# Patient Record
Sex: Male | Born: 1955 | Race: Black or African American | Hispanic: No | Marital: Married | Smoking: Former smoker
Health system: Southern US, Community
[De-identification: ages and names within clinical notes are randomized; demographics above are authoritative.]

## PROBLEM LIST (undated history)

## (undated) DIAGNOSIS — I340 Nonrheumatic mitral (valve) insufficiency: Secondary | ICD-10-CM

## (undated) DIAGNOSIS — R748 Abnormal levels of other serum enzymes: Secondary | ICD-10-CM

## (undated) DIAGNOSIS — M5090 Cervical disc disorder, unspecified, unspecified cervical region: Secondary | ICD-10-CM

## (undated) DIAGNOSIS — I10 Essential (primary) hypertension: Secondary | ICD-10-CM

## (undated) HISTORY — PX: OTHER SURGICAL HISTORY: SHX169

## (undated) HISTORY — DX: Nonrheumatic mitral (valve) insufficiency: I34.0

## (undated) HISTORY — DX: Essential (primary) hypertension: I10

## (undated) HISTORY — DX: Abnormal levels of other serum enzymes: R74.8

## (undated) HISTORY — DX: Cervical disc disorder, unspecified, unspecified cervical region: M50.90

---

## 1999-05-05 ENCOUNTER — Encounter: Admission: RE | Admit: 1999-05-05 | Discharge: 1999-05-05 | Payer: Self-pay | Admitting: Otolaryngology

## 1999-05-05 ENCOUNTER — Encounter: Payer: Self-pay | Admitting: Otolaryngology

## 2001-07-31 ENCOUNTER — Encounter: Admission: RE | Admit: 2001-07-31 | Discharge: 2001-07-31 | Payer: Self-pay | Admitting: Geriatric Medicine

## 2001-07-31 ENCOUNTER — Encounter: Payer: Self-pay | Admitting: Geriatric Medicine

## 2009-12-16 ENCOUNTER — Ambulatory Visit (HOSPITAL_COMMUNITY): Admission: RE | Admit: 2009-12-16 | Discharge: 2009-12-16 | Payer: Self-pay | Admitting: Gastroenterology

## 2014-11-06 ENCOUNTER — Ambulatory Visit
Admission: RE | Admit: 2014-11-06 | Discharge: 2014-11-06 | Disposition: A | Discharge status: Home / Self Care | Payer: BLUE CROSS/BLUE SHIELD | Source: Ambulatory Visit | Attending: Geriatric Medicine | Admitting: Geriatric Medicine

## 2014-11-06 ENCOUNTER — Other Ambulatory Visit: Payer: Self-pay | Admitting: Geriatric Medicine

## 2014-11-06 DIAGNOSIS — R0989 Other specified symptoms and signs involving the circulatory and respiratory systems: Secondary | ICD-10-CM

## 2015-04-28 ENCOUNTER — Other Ambulatory Visit: Payer: Self-pay | Admitting: Internal Medicine

## 2015-04-28 ENCOUNTER — Ambulatory Visit
Admission: RE | Admit: 2015-04-28 | Discharge: 2015-04-28 | Disposition: A | Discharge status: Home / Self Care | Payer: BLUE CROSS/BLUE SHIELD | Source: Ambulatory Visit | Attending: Internal Medicine | Admitting: Internal Medicine

## 2015-04-28 DIAGNOSIS — M25562 Pain in left knee: Secondary | ICD-10-CM

## 2015-11-10 ENCOUNTER — Other Ambulatory Visit (HOSPITAL_COMMUNITY): Payer: Self-pay | Admitting: Geriatric Medicine

## 2015-11-10 DIAGNOSIS — I340 Nonrheumatic mitral (valve) insufficiency: Secondary | ICD-10-CM

## 2015-11-10 DIAGNOSIS — Z79899 Other long term (current) drug therapy: Secondary | ICD-10-CM | POA: Diagnosis not present

## 2015-11-10 DIAGNOSIS — I10 Essential (primary) hypertension: Secondary | ICD-10-CM | POA: Diagnosis not present

## 2015-11-10 DIAGNOSIS — Z0000 Encounter for general adult medical examination without abnormal findings: Secondary | ICD-10-CM | POA: Diagnosis not present

## 2015-11-24 ENCOUNTER — Ambulatory Visit (HOSPITAL_COMMUNITY): Payer: BLUE CROSS/BLUE SHIELD | Attending: Cardiology

## 2015-11-24 ENCOUNTER — Other Ambulatory Visit: Payer: Self-pay

## 2015-11-24 DIAGNOSIS — I340 Nonrheumatic mitral (valve) insufficiency: Secondary | ICD-10-CM | POA: Insufficient documentation

## 2015-12-03 ENCOUNTER — Ambulatory Visit: Payer: BLUE CROSS/BLUE SHIELD

## 2015-12-03 ENCOUNTER — Ambulatory Visit (INDEPENDENT_AMBULATORY_CARE_PROVIDER_SITE_OTHER): Payer: BLUE CROSS/BLUE SHIELD

## 2015-12-03 ENCOUNTER — Ambulatory Visit (INDEPENDENT_AMBULATORY_CARE_PROVIDER_SITE_OTHER): Payer: BLUE CROSS/BLUE SHIELD | Admitting: Podiatry

## 2015-12-03 ENCOUNTER — Encounter: Payer: Self-pay | Admitting: Podiatry

## 2015-12-03 VITALS — BP 124/78 | HR 73 | Resp 16

## 2015-12-03 DIAGNOSIS — B372 Candidiasis of skin and nail: Secondary | ICD-10-CM | POA: Diagnosis not present

## 2015-12-03 DIAGNOSIS — M79675 Pain in left toe(s): Secondary | ICD-10-CM

## 2015-12-03 DIAGNOSIS — M79671 Pain in right foot: Secondary | ICD-10-CM | POA: Diagnosis not present

## 2015-12-03 MED ORDER — TERBINAFINE HCL 250 MG PO TABS: 250.0000 mg | ORAL_TABLET | Freq: Every day | ORAL | Status: AC

## 2015-12-03 NOTE — Progress Notes (Signed)
   Subjective:    Patient ID: Carl Nash, male    DOB: 1955-11-29, 60 y.o.   MRN: [id number]  HPI Chief Complaint  Patient presents with  . Toe Pain    Left foot; 2nd toe; discoloration of toe; pt stated, "got bitten an insect a year ago and has had antibiotics; after this toe discolored"  . Callouses    Right foot; 5th toe-top & below of toe; x6 months      Review of Systems  Musculoskeletal: Positive for arthralgias.  Allergic/Immunologic: Positive for environmental allergies.  All other systems reviewed and are negative.      Objective:   Physical Exam        Assessment & Plan:

## 2015-12-05 NOTE — Progress Notes (Signed)
Subjective:     Patient ID: Carl RakeVan J Nash, male   DOB: Dec 28, 1955, 60 y.o.   MRN: [id number]  HPI patient states I have had problems with my left foot around the second digit with discoloration and irritation and this is been going on now for around a year after I was bit by an insect. It itches and at times blisters and I was on an antibiotic early   Review of Systems  All other systems reviewed and are negative.      Objective:   Physical Exam  Constitutional: He is oriented to person, place, and time.  Cardiovascular: Intact distal pulses.   Musculoskeletal: Normal range of motion.  Neurological: He is oriented to person, place, and time.  Skin: Skin is warm.  Nursing note and vitals reviewed.  neurovascular status intact muscle strength adequate range of motion within normal limits with patient found to have discoloration of the second digit left foot with blistering around the distal lateral tip and no proximal edema erythema drainage noted. Also slight discoloration of the right fifth digit     Assessment:     May be a fungal infection versus other pathology at this time    Plan:     H&P and x-ray reviewed. At this time I placed him on 45 days of oral antifungal therapy along with over-the-counter Lamisil topical and we will reevaluate him again in 2 or 3 months or earlier if any issues should occur  Report left negative for signs of bony changes or other pathology

## 2016-04-11 DIAGNOSIS — Z23 Encounter for immunization: Secondary | ICD-10-CM | POA: Diagnosis not present

## 2016-09-13 DIAGNOSIS — H2513 Age-related nuclear cataract, bilateral: Secondary | ICD-10-CM | POA: Diagnosis not present

## 2016-09-13 DIAGNOSIS — H40003 Preglaucoma, unspecified, bilateral: Secondary | ICD-10-CM | POA: Diagnosis not present

## 2016-09-13 DIAGNOSIS — H40023 Open angle with borderline findings, high risk, bilateral: Secondary | ICD-10-CM | POA: Diagnosis not present

## 2016-09-13 DIAGNOSIS — H40033 Anatomical narrow angle, bilateral: Secondary | ICD-10-CM | POA: Diagnosis not present

## 2016-11-23 DIAGNOSIS — H40033 Anatomical narrow angle, bilateral: Secondary | ICD-10-CM | POA: Diagnosis not present

## 2016-11-23 DIAGNOSIS — H25013 Cortical age-related cataract, bilateral: Secondary | ICD-10-CM | POA: Diagnosis not present

## 2016-11-23 DIAGNOSIS — H40011 Open angle with borderline findings, low risk, right eye: Secondary | ICD-10-CM | POA: Diagnosis not present

## 2016-11-23 DIAGNOSIS — H40012 Open angle with borderline findings, low risk, left eye: Secondary | ICD-10-CM | POA: Diagnosis not present

## 2017-01-22 DIAGNOSIS — Z125 Encounter for screening for malignant neoplasm of prostate: Secondary | ICD-10-CM | POA: Diagnosis not present

## 2017-01-22 DIAGNOSIS — Z0000 Encounter for general adult medical examination without abnormal findings: Secondary | ICD-10-CM | POA: Diagnosis not present

## 2017-01-22 DIAGNOSIS — I10 Essential (primary) hypertension: Secondary | ICD-10-CM | POA: Diagnosis not present

## 2017-01-22 DIAGNOSIS — Z79899 Other long term (current) drug therapy: Secondary | ICD-10-CM | POA: Diagnosis not present

## 2017-04-17 DIAGNOSIS — Z23 Encounter for immunization: Secondary | ICD-10-CM | POA: Diagnosis not present

## 2017-04-18 ENCOUNTER — Other Ambulatory Visit: Payer: Self-pay | Admitting: Geriatric Medicine

## 2017-04-18 ENCOUNTER — Ambulatory Visit
Admission: RE | Admit: 2017-04-18 | Discharge: 2017-04-18 | Disposition: A | Discharge status: Home / Self Care | Payer: BLUE CROSS/BLUE SHIELD | Source: Ambulatory Visit | Attending: Geriatric Medicine | Admitting: Geriatric Medicine

## 2017-04-18 DIAGNOSIS — M542 Cervicalgia: Secondary | ICD-10-CM

## 2017-07-25 DIAGNOSIS — Z79899 Other long term (current) drug therapy: Secondary | ICD-10-CM | POA: Diagnosis not present

## 2017-07-25 DIAGNOSIS — M5410 Radiculopathy, site unspecified: Secondary | ICD-10-CM | POA: Diagnosis not present

## 2017-07-25 DIAGNOSIS — I10 Essential (primary) hypertension: Secondary | ICD-10-CM | POA: Diagnosis not present

## 2018-02-15 DIAGNOSIS — Z136 Encounter for screening for cardiovascular disorders: Secondary | ICD-10-CM | POA: Diagnosis not present

## 2018-02-15 DIAGNOSIS — Z79899 Other long term (current) drug therapy: Secondary | ICD-10-CM | POA: Diagnosis not present

## 2018-02-15 DIAGNOSIS — I10 Essential (primary) hypertension: Secondary | ICD-10-CM | POA: Diagnosis not present

## 2018-02-15 DIAGNOSIS — Z0000 Encounter for general adult medical examination without abnormal findings: Secondary | ICD-10-CM | POA: Diagnosis not present

## 2018-02-20 ENCOUNTER — Ambulatory Visit
Admission: RE | Admit: 2018-02-20 | Discharge: 2018-02-20 | Disposition: A | Discharge status: Home / Self Care | Payer: BLUE CROSS/BLUE SHIELD | Source: Ambulatory Visit | Attending: Geriatric Medicine | Admitting: Geriatric Medicine

## 2018-02-20 ENCOUNTER — Other Ambulatory Visit: Payer: Self-pay | Admitting: Geriatric Medicine

## 2018-02-20 DIAGNOSIS — J3489 Other specified disorders of nose and nasal sinuses: Secondary | ICD-10-CM

## 2018-02-20 DIAGNOSIS — J320 Chronic maxillary sinusitis: Secondary | ICD-10-CM | POA: Diagnosis not present

## 2018-04-16 DIAGNOSIS — Z23 Encounter for immunization: Secondary | ICD-10-CM | POA: Diagnosis not present

## 2018-08-21 DIAGNOSIS — Z79899 Other long term (current) drug therapy: Secondary | ICD-10-CM | POA: Diagnosis not present

## 2018-08-21 DIAGNOSIS — I10 Essential (primary) hypertension: Secondary | ICD-10-CM | POA: Diagnosis not present

## 2018-11-11 DIAGNOSIS — I10 Essential (primary) hypertension: Secondary | ICD-10-CM | POA: Diagnosis not present

## 2019-01-23 DIAGNOSIS — H353121 Nonexudative age-related macular degeneration, left eye, early dry stage: Secondary | ICD-10-CM | POA: Diagnosis not present

## 2019-01-23 DIAGNOSIS — H04123 Dry eye syndrome of bilateral lacrimal glands: Secondary | ICD-10-CM | POA: Diagnosis not present

## 2019-01-23 DIAGNOSIS — H353110 Nonexudative age-related macular degeneration, right eye, stage unspecified: Secondary | ICD-10-CM | POA: Diagnosis not present

## 2019-01-23 DIAGNOSIS — H40023 Open angle with borderline findings, high risk, bilateral: Secondary | ICD-10-CM | POA: Diagnosis not present

## 2019-04-21 DIAGNOSIS — Z23 Encounter for immunization: Secondary | ICD-10-CM | POA: Diagnosis not present

## 2019-04-21 DIAGNOSIS — Z0000 Encounter for general adult medical examination without abnormal findings: Secondary | ICD-10-CM | POA: Diagnosis not present

## 2019-04-21 DIAGNOSIS — Z79899 Other long term (current) drug therapy: Secondary | ICD-10-CM | POA: Diagnosis not present

## 2019-04-21 DIAGNOSIS — I10 Essential (primary) hypertension: Secondary | ICD-10-CM | POA: Diagnosis not present

## 2019-05-09 DIAGNOSIS — L509 Urticaria, unspecified: Secondary | ICD-10-CM | POA: Diagnosis not present

## 2019-06-23 DIAGNOSIS — Z03818 Encounter for observation for suspected exposure to other biological agents ruled out: Secondary | ICD-10-CM | POA: Diagnosis not present

## 2019-07-24 DIAGNOSIS — H353110 Nonexudative age-related macular degeneration, right eye, stage unspecified: Secondary | ICD-10-CM | POA: Diagnosis not present

## 2019-07-24 DIAGNOSIS — H0015 Chalazion left lower eyelid: Secondary | ICD-10-CM | POA: Diagnosis not present

## 2019-07-24 DIAGNOSIS — H40033 Anatomical narrow angle, bilateral: Secondary | ICD-10-CM | POA: Diagnosis not present

## 2019-07-24 DIAGNOSIS — H353121 Nonexudative age-related macular degeneration, left eye, early dry stage: Secondary | ICD-10-CM | POA: Diagnosis not present

## 2019-08-11 DIAGNOSIS — H0015 Chalazion left lower eyelid: Secondary | ICD-10-CM | POA: Diagnosis not present

## 2019-08-11 DIAGNOSIS — H0102A Squamous blepharitis right eye, upper and lower eyelids: Secondary | ICD-10-CM | POA: Diagnosis not present

## 2019-08-11 DIAGNOSIS — H0102B Squamous blepharitis left eye, upper and lower eyelids: Secondary | ICD-10-CM | POA: Diagnosis not present

## 2019-08-13 IMAGING — DX DG SINUSES 1-2V
2 series · 2 of 2 positions shown · non-contrast
Comparison: Diffuse mucosal edema throughout the right maxillary
sinus.

CLINICAL DATA: Maxillary and frontal sinus pressure

EXAM:
PARANASAL SINUSES - 1-2 VIEW

[dg sinus 1-2 views (1 of 2)]
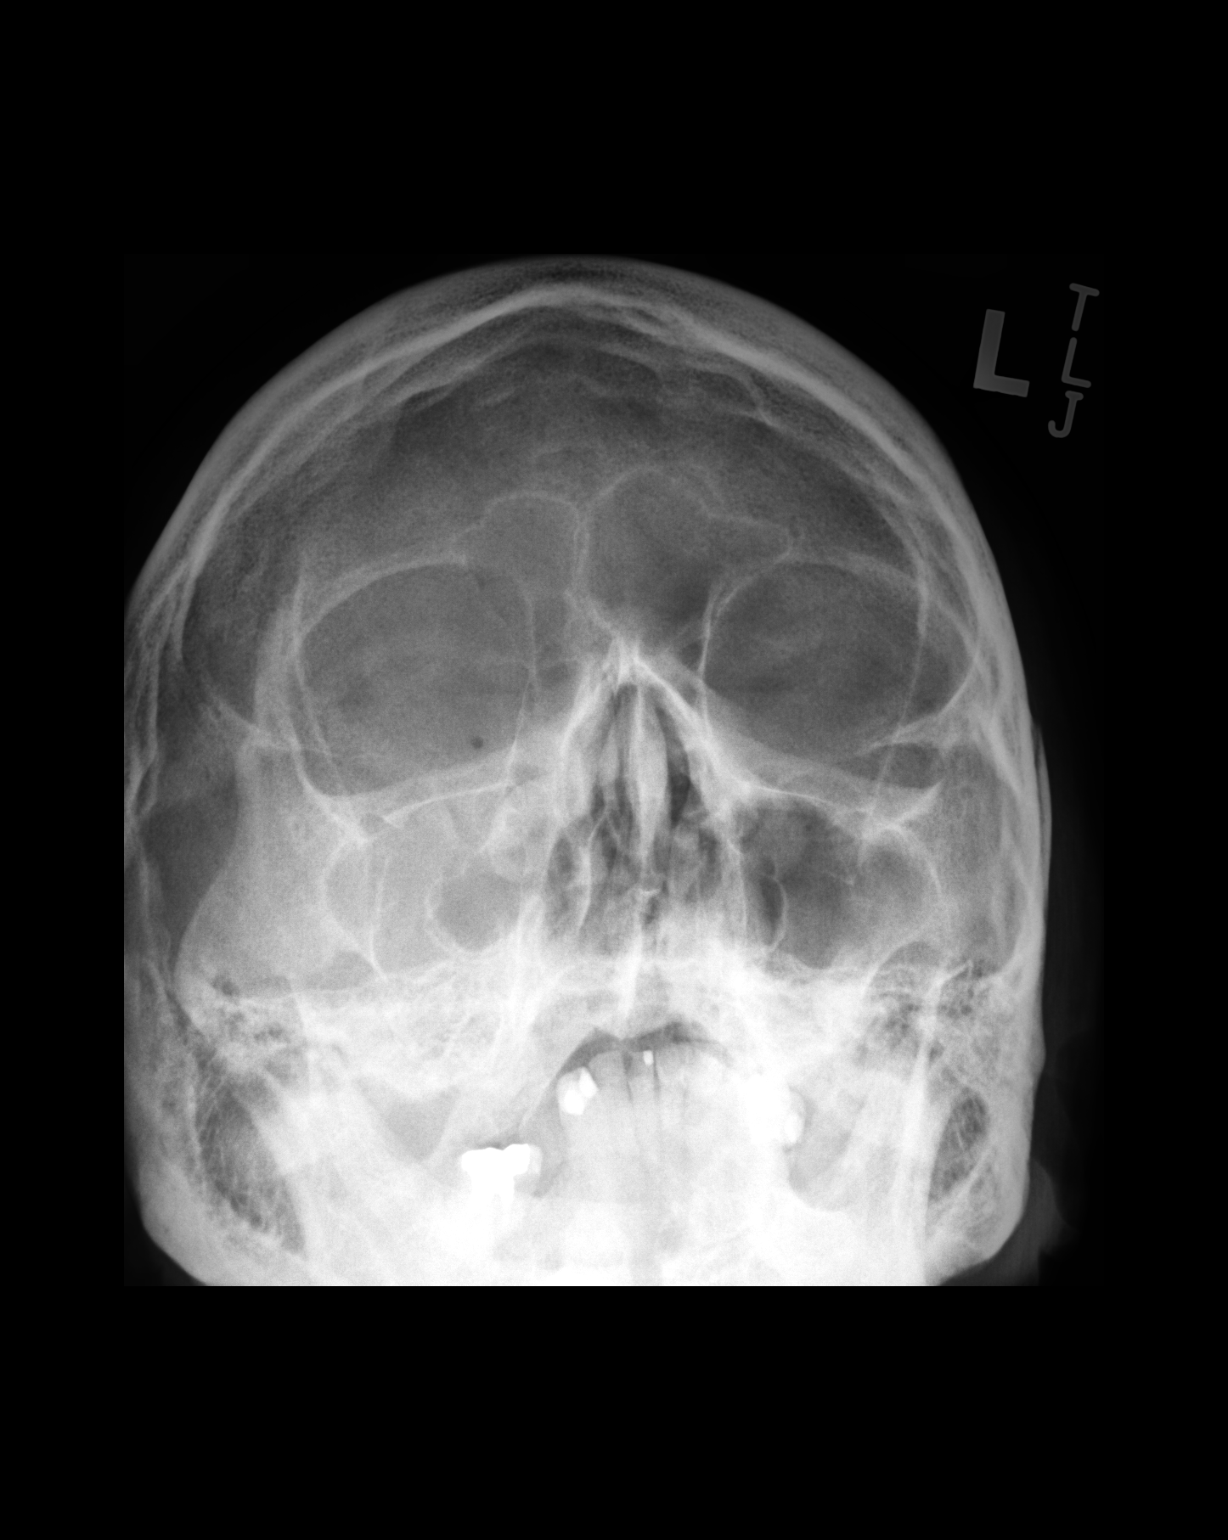

[dg sinus 1-2 views (2 of 2)]
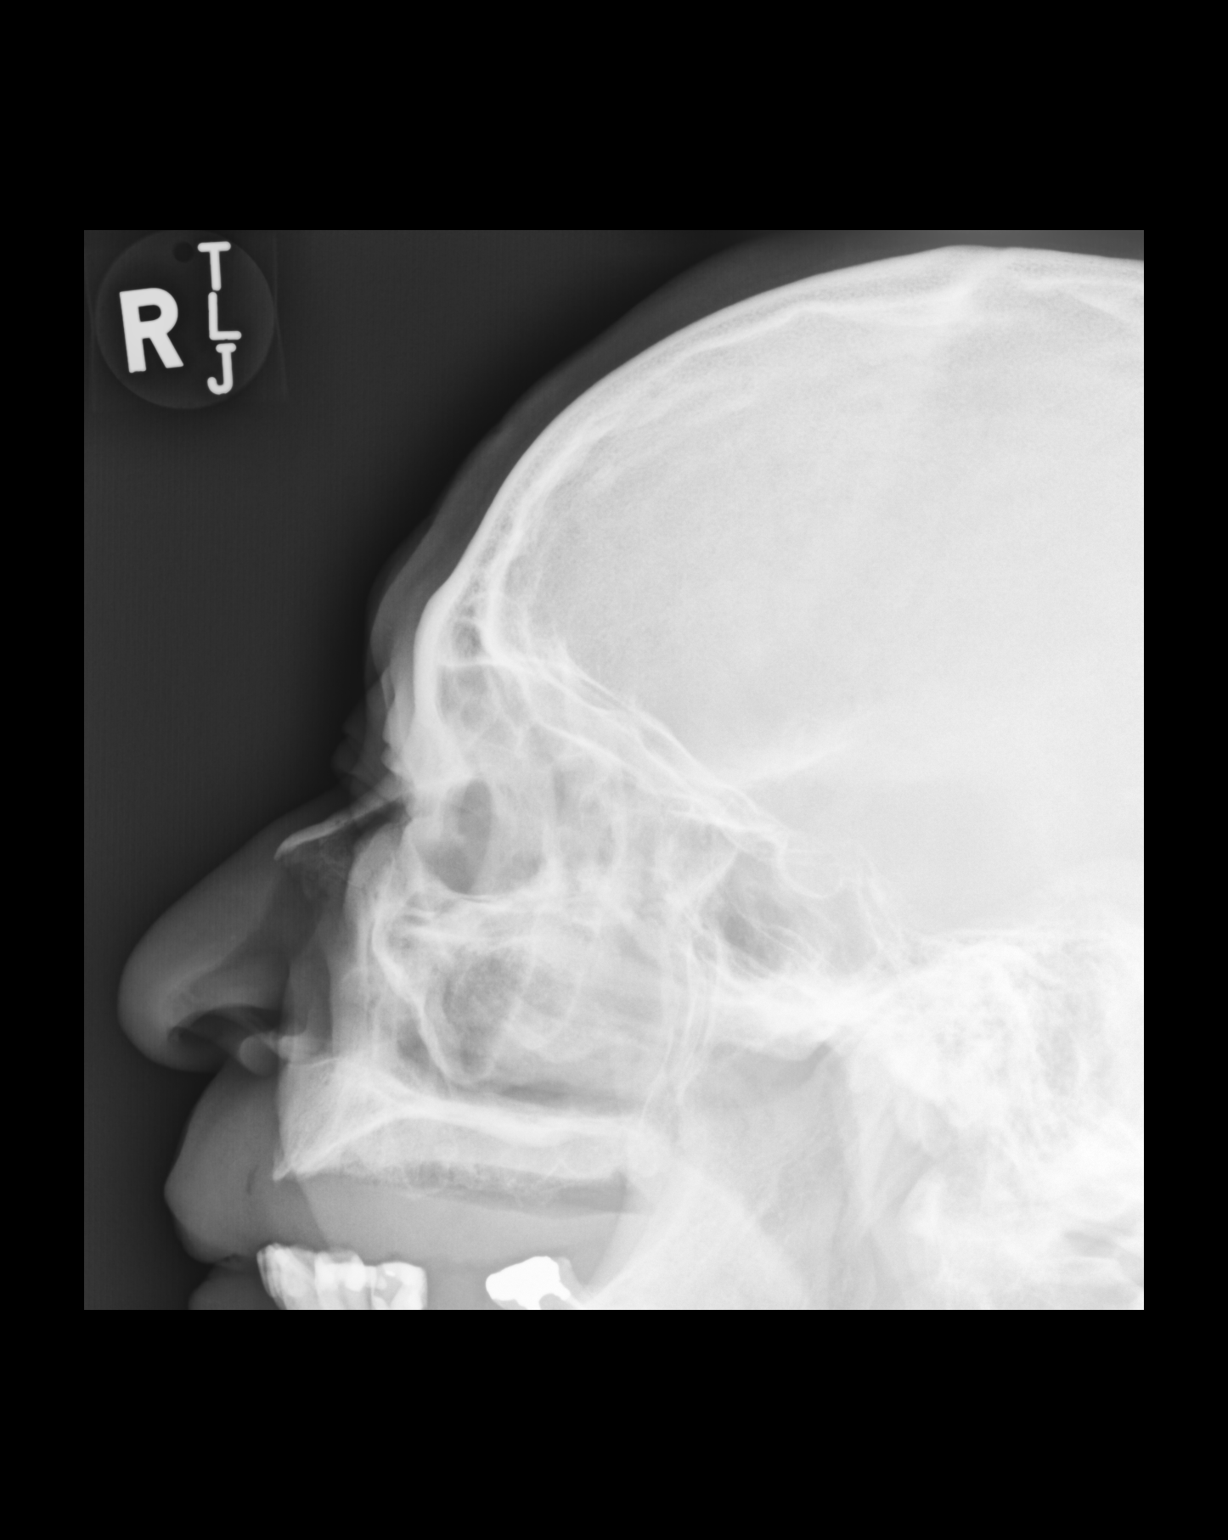

[2 of 2 positions shown; findings below may reference images not displayed]

Left maxillary sinus clear. Frontal and ethmoid sinuses
clear. Mastoid sinuses clear. Sphenoid sinus clear
FINDINGS: Moderate mucosal thickening right frontal sinus.
IMPRESSION: Negative.

## 2020-01-21 DIAGNOSIS — H5213 Myopia, bilateral: Secondary | ICD-10-CM | POA: Diagnosis not present

## 2020-01-21 DIAGNOSIS — H524 Presbyopia: Secondary | ICD-10-CM | POA: Diagnosis not present

## 2020-01-21 DIAGNOSIS — H2513 Age-related nuclear cataract, bilateral: Secondary | ICD-10-CM | POA: Diagnosis not present

## 2020-01-21 DIAGNOSIS — H52223 Regular astigmatism, bilateral: Secondary | ICD-10-CM | POA: Diagnosis not present

## 2020-01-21 DIAGNOSIS — H40023 Open angle with borderline findings, high risk, bilateral: Secondary | ICD-10-CM | POA: Diagnosis not present

## 2020-01-21 DIAGNOSIS — H04123 Dry eye syndrome of bilateral lacrimal glands: Secondary | ICD-10-CM | POA: Diagnosis not present

## 2020-01-21 DIAGNOSIS — H353131 Nonexudative age-related macular degeneration, bilateral, early dry stage: Secondary | ICD-10-CM | POA: Diagnosis not present

## 2020-05-20 DIAGNOSIS — Z23 Encounter for immunization: Secondary | ICD-10-CM | POA: Diagnosis not present

## 2020-05-31 DIAGNOSIS — Z79899 Other long term (current) drug therapy: Secondary | ICD-10-CM | POA: Diagnosis not present

## 2020-05-31 DIAGNOSIS — I10 Essential (primary) hypertension: Secondary | ICD-10-CM | POA: Diagnosis not present

## 2020-05-31 DIAGNOSIS — Z125 Encounter for screening for malignant neoplasm of prostate: Secondary | ICD-10-CM | POA: Diagnosis not present

## 2020-05-31 DIAGNOSIS — Z0000 Encounter for general adult medical examination without abnormal findings: Secondary | ICD-10-CM | POA: Diagnosis not present

## 2020-07-11 DIAGNOSIS — Z20828 Contact with and (suspected) exposure to other viral communicable diseases: Secondary | ICD-10-CM | POA: Diagnosis not present

## 2020-07-27 DIAGNOSIS — H40023 Open angle with borderline findings, high risk, bilateral: Secondary | ICD-10-CM | POA: Diagnosis not present

## 2020-07-27 DIAGNOSIS — H00015 Hordeolum externum left lower eyelid: Secondary | ICD-10-CM | POA: Diagnosis not present

## 2020-07-27 DIAGNOSIS — H353131 Nonexudative age-related macular degeneration, bilateral, early dry stage: Secondary | ICD-10-CM | POA: Diagnosis not present

## 2020-07-27 DIAGNOSIS — H00014 Hordeolum externum left upper eyelid: Secondary | ICD-10-CM | POA: Diagnosis not present

## 2020-09-24 DIAGNOSIS — Z1211 Encounter for screening for malignant neoplasm of colon: Secondary | ICD-10-CM | POA: Diagnosis not present

## 2020-11-30 DIAGNOSIS — Z79899 Other long term (current) drug therapy: Secondary | ICD-10-CM | POA: Diagnosis not present

## 2020-11-30 DIAGNOSIS — I10 Essential (primary) hypertension: Secondary | ICD-10-CM | POA: Diagnosis not present

## 2020-11-30 DIAGNOSIS — K219 Gastro-esophageal reflux disease without esophagitis: Secondary | ICD-10-CM | POA: Diagnosis not present

## 2021-02-03 ENCOUNTER — Other Ambulatory Visit: Payer: Self-pay

## 2021-02-03 ENCOUNTER — Ambulatory Visit
Admission: RE | Admit: 2021-02-03 | Discharge: 2021-02-03 | Disposition: A | Discharge status: Home / Self Care | Payer: BLUE CROSS/BLUE SHIELD | Source: Ambulatory Visit | Attending: Internal Medicine | Admitting: Internal Medicine

## 2021-02-03 ENCOUNTER — Other Ambulatory Visit: Payer: Self-pay | Admitting: Internal Medicine

## 2021-02-03 DIAGNOSIS — M25512 Pain in left shoulder: Secondary | ICD-10-CM | POA: Diagnosis not present

## 2021-05-30 DIAGNOSIS — H04123 Dry eye syndrome of bilateral lacrimal glands: Secondary | ICD-10-CM | POA: Diagnosis not present

## 2021-05-30 DIAGNOSIS — H40023 Open angle with borderline findings, high risk, bilateral: Secondary | ICD-10-CM | POA: Diagnosis not present

## 2021-05-30 DIAGNOSIS — H0015 Chalazion left lower eyelid: Secondary | ICD-10-CM | POA: Diagnosis not present

## 2021-05-30 DIAGNOSIS — H0102A Squamous blepharitis right eye, upper and lower eyelids: Secondary | ICD-10-CM | POA: Diagnosis not present

## 2021-06-08 DIAGNOSIS — I10 Essential (primary) hypertension: Secondary | ICD-10-CM | POA: Diagnosis not present

## 2021-06-08 DIAGNOSIS — Z79899 Other long term (current) drug therapy: Secondary | ICD-10-CM | POA: Diagnosis not present

## 2021-06-08 DIAGNOSIS — Z125 Encounter for screening for malignant neoplasm of prostate: Secondary | ICD-10-CM | POA: Diagnosis not present

## 2021-06-08 DIAGNOSIS — Z23 Encounter for immunization: Secondary | ICD-10-CM | POA: Diagnosis not present

## 2021-06-08 DIAGNOSIS — Z0000 Encounter for general adult medical examination without abnormal findings: Secondary | ICD-10-CM | POA: Diagnosis not present

## 2021-12-07 DIAGNOSIS — I10 Essential (primary) hypertension: Secondary | ICD-10-CM | POA: Diagnosis not present

## 2021-12-07 DIAGNOSIS — Z79899 Other long term (current) drug therapy: Secondary | ICD-10-CM | POA: Diagnosis not present

## 2021-12-07 DIAGNOSIS — R21 Rash and other nonspecific skin eruption: Secondary | ICD-10-CM | POA: Diagnosis not present

## 2021-12-23 ENCOUNTER — Ambulatory Visit
Admission: RE | Admit: 2021-12-23 | Discharge: 2021-12-23 | Disposition: A | Discharge status: Home / Self Care | Payer: BC Managed Care – PPO | Source: Ambulatory Visit | Attending: Internal Medicine | Admitting: Internal Medicine

## 2021-12-23 ENCOUNTER — Other Ambulatory Visit: Payer: Self-pay | Admitting: Internal Medicine

## 2021-12-23 DIAGNOSIS — M1310 Monoarthritis, not elsewhere classified, unspecified site: Secondary | ICD-10-CM | POA: Diagnosis not present

## 2021-12-23 DIAGNOSIS — M7989 Other specified soft tissue disorders: Secondary | ICD-10-CM | POA: Diagnosis not present

## 2021-12-30 DIAGNOSIS — H0015 Chalazion left lower eyelid: Secondary | ICD-10-CM | POA: Diagnosis not present

## 2021-12-30 DIAGNOSIS — H0102A Squamous blepharitis right eye, upper and lower eyelids: Secondary | ICD-10-CM | POA: Diagnosis not present

## 2021-12-30 DIAGNOSIS — H25813 Combined forms of age-related cataract, bilateral: Secondary | ICD-10-CM | POA: Diagnosis not present

## 2021-12-30 DIAGNOSIS — H40023 Open angle with borderline findings, high risk, bilateral: Secondary | ICD-10-CM | POA: Diagnosis not present

## 2022-01-05 DIAGNOSIS — L718 Other rosacea: Secondary | ICD-10-CM | POA: Diagnosis not present

## 2022-02-02 DIAGNOSIS — K648 Other hemorrhoids: Secondary | ICD-10-CM | POA: Diagnosis not present

## 2022-02-02 DIAGNOSIS — K621 Rectal polyp: Secondary | ICD-10-CM | POA: Diagnosis not present

## 2022-02-02 DIAGNOSIS — Z1211 Encounter for screening for malignant neoplasm of colon: Secondary | ICD-10-CM | POA: Diagnosis not present

## 2022-02-02 DIAGNOSIS — K635 Polyp of colon: Secondary | ICD-10-CM | POA: Diagnosis not present

## 2022-07-26 DIAGNOSIS — H40023 Open angle with borderline findings, high risk, bilateral: Secondary | ICD-10-CM | POA: Diagnosis not present

## 2022-07-26 DIAGNOSIS — H0102A Squamous blepharitis right eye, upper and lower eyelids: Secondary | ICD-10-CM | POA: Diagnosis not present

## 2022-07-26 DIAGNOSIS — G43B0 Ophthalmoplegic migraine, not intractable: Secondary | ICD-10-CM | POA: Diagnosis not present

## 2022-07-26 DIAGNOSIS — H25813 Combined forms of age-related cataract, bilateral: Secondary | ICD-10-CM | POA: Diagnosis not present

## 2022-07-27 IMAGING — CR DG SHOULDER 2+V*L*
3 series · 3 of 3 positions shown · non-contrast
Comparison: None.

CLINICAL DATA: Acute pain of left shoulder. Pain in left posterior
scapula. No known injury.

EXAM:
LEFT SHOULDER - 2+ VIEW

[w shoulder ap internal left]
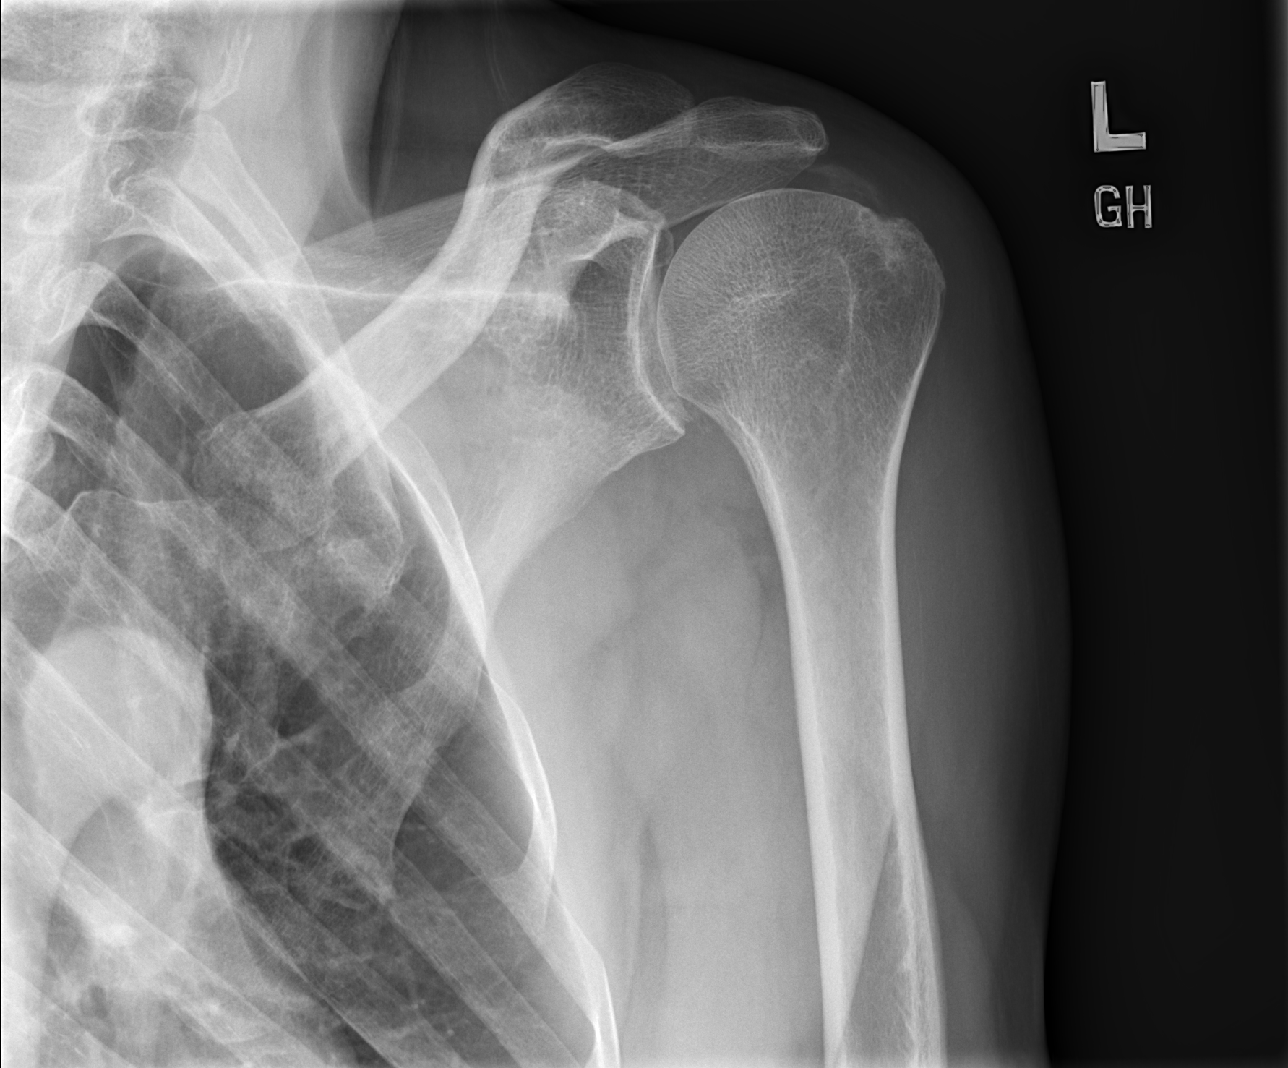

[w shoulder y view left]
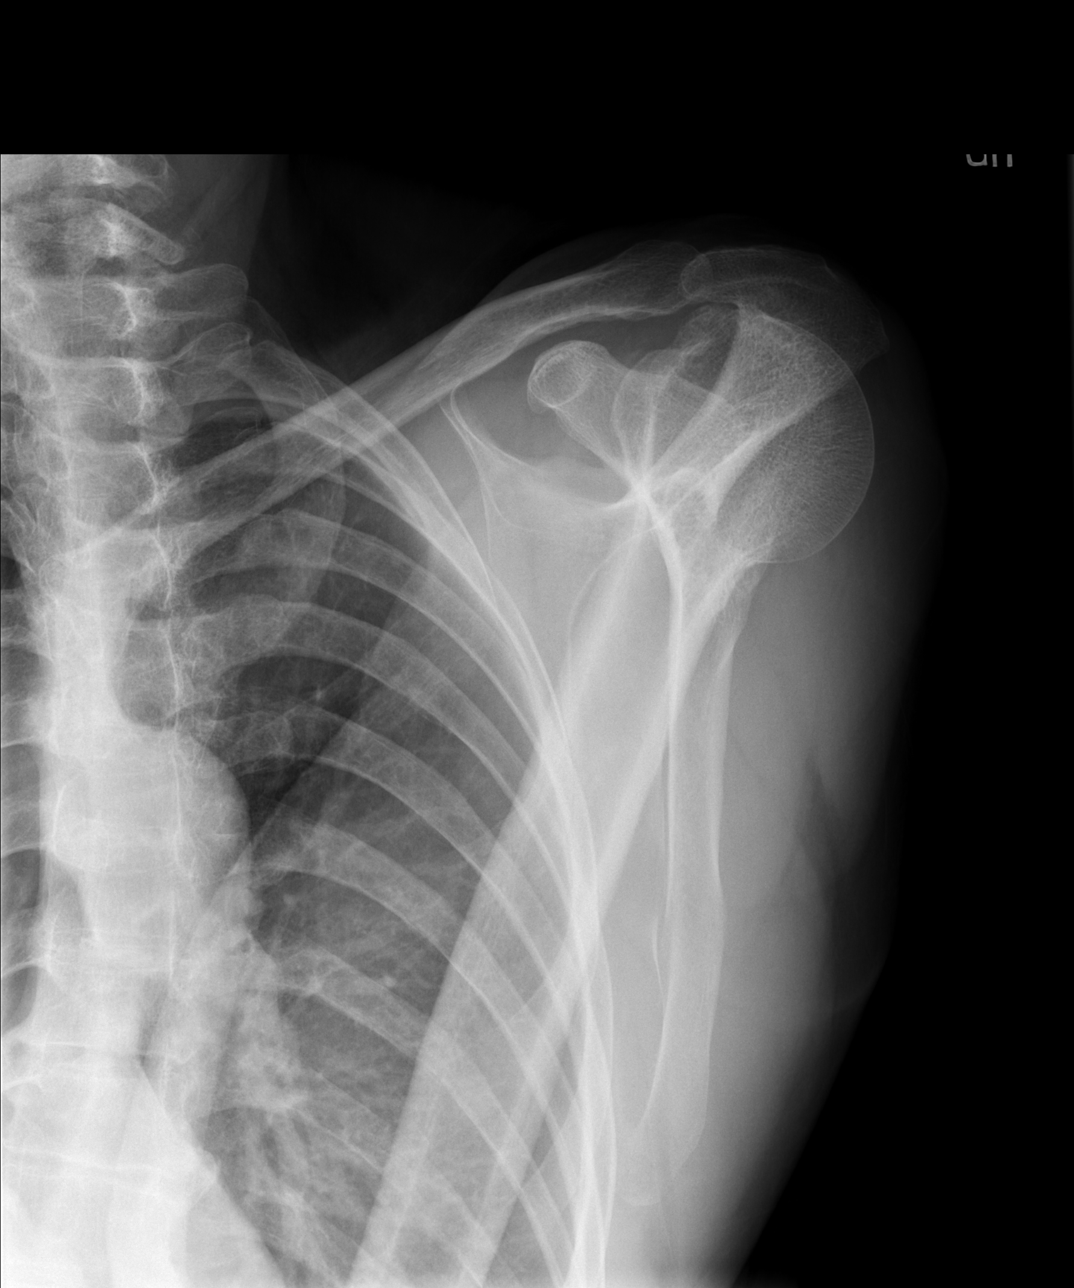

[w shoulder axillary left *]
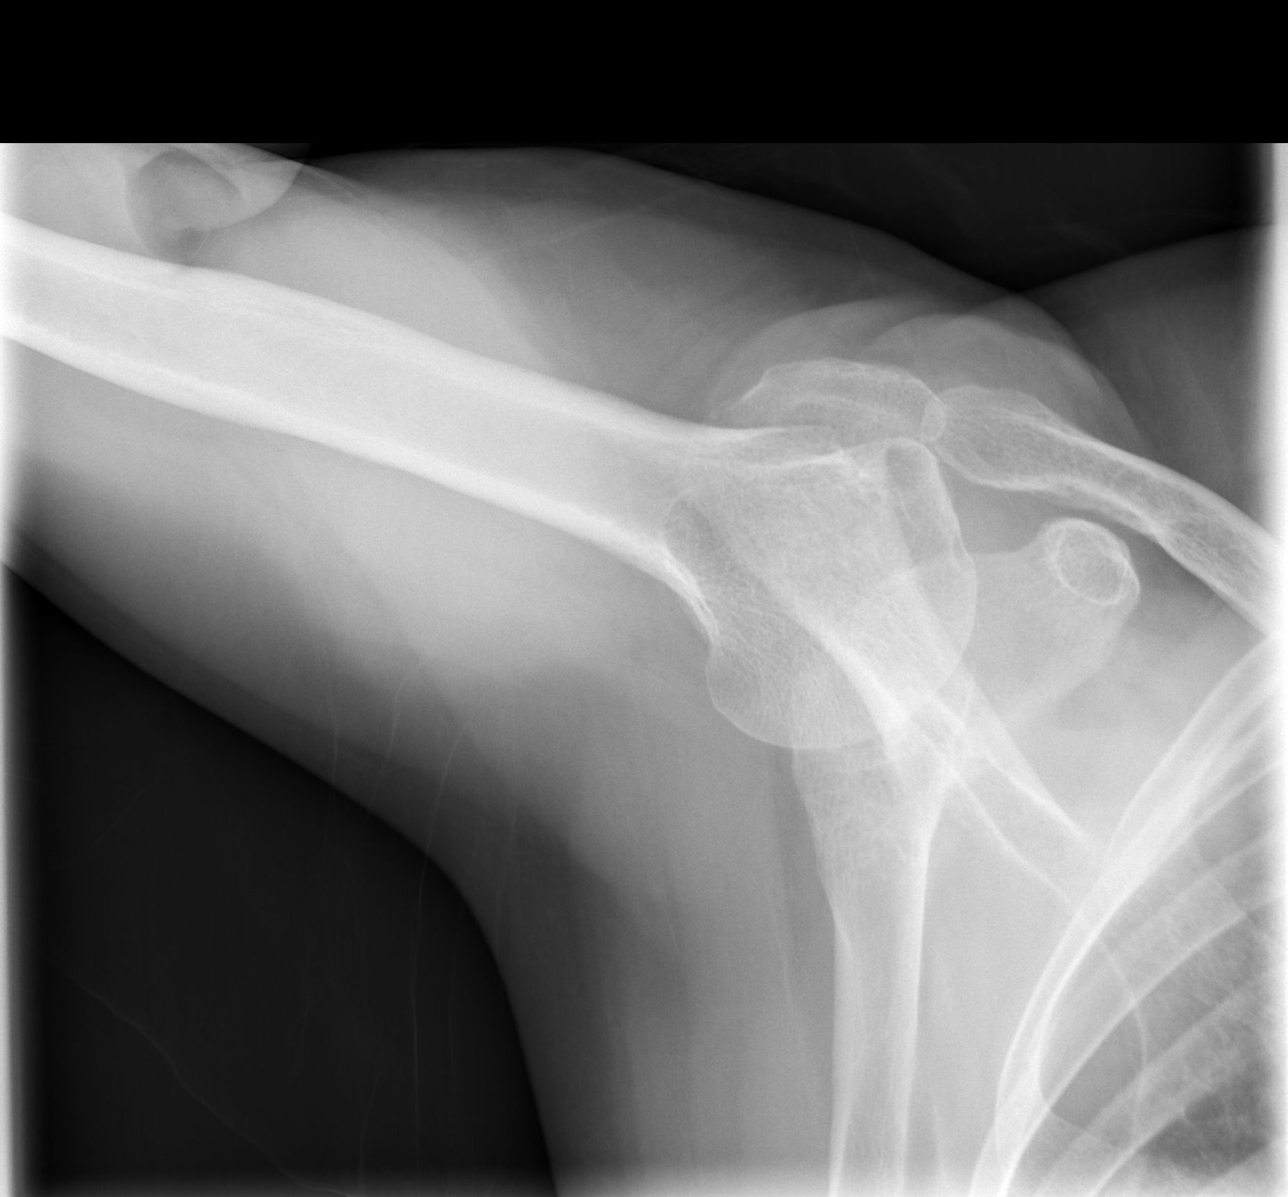

[3 of 3 positions shown; findings below may reference images not displayed]

FINDINGS: Mild sclerosis in the lateral humeral head, at the rotator cuff
insertion site. No fractures or dislocations. No other cause for
pain identified.
IMPRESSION: Sclerosis at the lateral humeral head at the rotator cuff insertion
site. No other cause for pain or significant abnormality identified.

## 2022-09-11 DIAGNOSIS — Z125 Encounter for screening for malignant neoplasm of prostate: Secondary | ICD-10-CM | POA: Diagnosis not present

## 2022-09-11 DIAGNOSIS — Z0000 Encounter for general adult medical examination without abnormal findings: Secondary | ICD-10-CM | POA: Diagnosis not present

## 2022-09-11 DIAGNOSIS — I10 Essential (primary) hypertension: Secondary | ICD-10-CM | POA: Diagnosis not present

## 2022-09-11 DIAGNOSIS — Z79899 Other long term (current) drug therapy: Secondary | ICD-10-CM | POA: Diagnosis not present

## 2022-09-11 DIAGNOSIS — D649 Anemia, unspecified: Secondary | ICD-10-CM | POA: Diagnosis not present

## 2022-09-15 DIAGNOSIS — M79605 Pain in left leg: Secondary | ICD-10-CM | POA: Diagnosis not present

## 2022-09-26 DIAGNOSIS — Z0000 Encounter for general adult medical examination without abnormal findings: Secondary | ICD-10-CM | POA: Diagnosis not present

## 2022-09-26 DIAGNOSIS — D649 Anemia, unspecified: Secondary | ICD-10-CM | POA: Diagnosis not present

## 2022-09-26 DIAGNOSIS — Z13220 Encounter for screening for lipoid disorders: Secondary | ICD-10-CM | POA: Diagnosis not present

## 2022-10-13 DIAGNOSIS — D649 Anemia, unspecified: Secondary | ICD-10-CM | POA: Diagnosis not present

## 2022-10-13 DIAGNOSIS — D508 Other iron deficiency anemias: Secondary | ICD-10-CM | POA: Diagnosis not present

## 2022-10-24 DIAGNOSIS — M545 Low back pain, unspecified: Secondary | ICD-10-CM | POA: Diagnosis not present

## 2022-10-24 DIAGNOSIS — M5432 Sciatica, left side: Secondary | ICD-10-CM | POA: Diagnosis not present

## 2022-11-21 DIAGNOSIS — M5432 Sciatica, left side: Secondary | ICD-10-CM | POA: Diagnosis not present

## 2022-11-21 DIAGNOSIS — M545 Low back pain, unspecified: Secondary | ICD-10-CM | POA: Diagnosis not present

## 2023-01-24 DIAGNOSIS — H25813 Combined forms of age-related cataract, bilateral: Secondary | ICD-10-CM | POA: Diagnosis not present

## 2023-01-24 DIAGNOSIS — H04123 Dry eye syndrome of bilateral lacrimal glands: Secondary | ICD-10-CM | POA: Diagnosis not present

## 2023-01-24 DIAGNOSIS — H40023 Open angle with borderline findings, high risk, bilateral: Secondary | ICD-10-CM | POA: Diagnosis not present

## 2023-01-24 DIAGNOSIS — G43B0 Ophthalmoplegic migraine, not intractable: Secondary | ICD-10-CM | POA: Diagnosis not present

## 2023-03-21 DIAGNOSIS — M5432 Sciatica, left side: Secondary | ICD-10-CM | POA: Diagnosis not present

## 2023-03-21 DIAGNOSIS — D649 Anemia, unspecified: Secondary | ICD-10-CM | POA: Diagnosis not present

## 2023-03-21 DIAGNOSIS — I340 Nonrheumatic mitral (valve) insufficiency: Secondary | ICD-10-CM | POA: Diagnosis not present

## 2023-03-21 DIAGNOSIS — I10 Essential (primary) hypertension: Secondary | ICD-10-CM | POA: Diagnosis not present

## 2023-09-21 ENCOUNTER — Other Ambulatory Visit (HOSPITAL_COMMUNITY): Payer: Self-pay | Admitting: Internal Medicine

## 2023-09-21 DIAGNOSIS — I340 Nonrheumatic mitral (valve) insufficiency: Secondary | ICD-10-CM

## 2023-10-18 ENCOUNTER — Ambulatory Visit (HOSPITAL_COMMUNITY): Attending: Internal Medicine

## 2023-10-18 DIAGNOSIS — I340 Nonrheumatic mitral (valve) insufficiency: Secondary | ICD-10-CM | POA: Insufficient documentation

## 2023-10-18 LAB — ECHOCARDIOGRAM COMPLETE
Area-P 1/2: 4.31 cm2
S' Lateral: 2.7 cm
